# Patient Record
Sex: Male | Born: 1950 | Race: White | Hispanic: No | Marital: Married | Smoking: Current every day smoker
Health system: Southern US, Community
[De-identification: ages and names within clinical notes are randomized; demographics above are authoritative.]

## PROBLEM LIST (undated history)

## (undated) DIAGNOSIS — G20 Parkinson's disease: Secondary | ICD-10-CM

## (undated) DIAGNOSIS — G20A1 Parkinson's disease without dyskinesia, without mention of fluctuations: Secondary | ICD-10-CM

## (undated) DIAGNOSIS — F0390 Unspecified dementia without behavioral disturbance: Secondary | ICD-10-CM

## (undated) HISTORY — PX: BRAIN SURGERY: SHX531

---

## 2017-02-21 DIAGNOSIS — G3183 Dementia with Lewy bodies: Secondary | ICD-10-CM | POA: Diagnosis not present

## 2017-02-21 DIAGNOSIS — R456 Violent behavior: Secondary | ICD-10-CM | POA: Diagnosis not present

## 2017-02-21 DIAGNOSIS — F0390 Unspecified dementia without behavioral disturbance: Secondary | ICD-10-CM | POA: Diagnosis not present

## 2017-02-21 DIAGNOSIS — R4586 Emotional lability: Secondary | ICD-10-CM | POA: Diagnosis not present

## 2017-02-21 DIAGNOSIS — F17210 Nicotine dependence, cigarettes, uncomplicated: Secondary | ICD-10-CM | POA: Diagnosis not present

## 2017-02-21 DIAGNOSIS — Z79899 Other long term (current) drug therapy: Secondary | ICD-10-CM | POA: Diagnosis not present

## 2017-02-21 DIAGNOSIS — F0280 Dementia in other diseases classified elsewhere without behavioral disturbance: Secondary | ICD-10-CM | POA: Diagnosis not present

## 2017-02-22 ENCOUNTER — Emergency Department (HOSPITAL_COMMUNITY): Payer: Medicare Other

## 2017-02-22 ENCOUNTER — Encounter (HOSPITAL_COMMUNITY): Payer: Self-pay | Admitting: Emergency Medicine

## 2017-02-22 ENCOUNTER — Emergency Department (HOSPITAL_COMMUNITY)
Admission: EM | Admit: 2017-02-22 | Discharge: 2017-02-23 | Disposition: A | Discharge status: Other Acute Inpatient Hospital | Payer: Medicare Other | Attending: Emergency Medicine | Admitting: Emergency Medicine

## 2017-02-22 DIAGNOSIS — F0281 Dementia in other diseases classified elsewhere with behavioral disturbance: Secondary | ICD-10-CM

## 2017-02-22 DIAGNOSIS — F0391 Unspecified dementia with behavioral disturbance: Secondary | ICD-10-CM | POA: Diagnosis present

## 2017-02-22 DIAGNOSIS — F03918 Unspecified dementia, unspecified severity, with other behavioral disturbance: Secondary | ICD-10-CM | POA: Diagnosis present

## 2017-02-22 DIAGNOSIS — G20 Parkinson's disease: Secondary | ICD-10-CM

## 2017-02-22 DIAGNOSIS — F02818 Dementia in other diseases classified elsewhere, unspecified severity, with other behavioral disturbance: Secondary | ICD-10-CM

## 2017-02-22 DIAGNOSIS — Z008 Encounter for other general examination: Secondary | ICD-10-CM

## 2017-02-22 HISTORY — DX: Parkinson's disease: G20

## 2017-02-22 HISTORY — DX: Parkinson's disease without dyskinesia, without mention of fluctuations: G20.A1

## 2017-02-22 HISTORY — DX: Unspecified dementia, unspecified severity, without behavioral disturbance, psychotic disturbance, mood disturbance, and anxiety: F03.90

## 2017-02-22 LAB — COMPREHENSIVE METABOLIC PANEL
ALK PHOS: 76 U/L (ref 38–126)
ALT: 5 U/L — AB (ref 17–63)
ANION GAP: 7 (ref 5–15)
AST: 23 U/L (ref 15–41)
Albumin: 4.1 g/dL (ref 3.5–5.0)
BUN: 20 mg/dL (ref 6–20)
CALCIUM: 9.2 mg/dL (ref 8.9–10.3)
CHLORIDE: 107 mmol/L (ref 101–111)
CO2: 24 mmol/L (ref 22–32)
Creatinine, Ser: 1.14 mg/dL (ref 0.61–1.24)
Glucose, Bld: 114 mg/dL — ABNORMAL HIGH (ref 65–99)
Potassium: 3.8 mmol/L (ref 3.5–5.1)
SODIUM: 138 mmol/L (ref 135–145)
Total Bilirubin: 0.5 mg/dL (ref 0.3–1.2)
Total Protein: 6.7 g/dL (ref 6.5–8.1)

## 2017-02-22 LAB — RAPID URINE DRUG SCREEN, HOSP PERFORMED
AMPHETAMINES: NOT DETECTED
BENZODIAZEPINES: NOT DETECTED
Barbiturates: NOT DETECTED
COCAINE: NOT DETECTED
Opiates: NOT DETECTED
Tetrahydrocannabinol: NOT DETECTED

## 2017-02-22 LAB — CBC
HCT: 39.9 % (ref 39.0–52.0)
Hemoglobin: 13.7 g/dL (ref 13.0–17.0)
MCH: 31.2 pg (ref 26.0–34.0)
MCHC: 34.3 g/dL (ref 30.0–36.0)
MCV: 90.9 fL (ref 78.0–100.0)
PLATELETS: 212 10*3/uL (ref 150–400)
RBC: 4.39 MIL/uL (ref 4.22–5.81)
RDW: 13.3 % (ref 11.5–15.5)
WBC: 9.9 10*3/uL (ref 4.0–10.5)

## 2017-02-22 LAB — ACETAMINOPHEN LEVEL: Acetaminophen (Tylenol), Serum: <10 ug/mL — ABNORMAL LOW (ref 10–30)

## 2017-02-22 LAB — ETHANOL

## 2017-02-22 LAB — SALICYLATE LEVEL: Salicylate Lvl: <7 mg/dL (ref 2.8–30.0)

## 2017-02-22 MED ORDER — BUPROPION HCL ER (XL) 150 MG PO TB24
150.0000 mg | ORAL_TABLET | Freq: Every day | ORAL | Status: DC
  Administered 2017-02-22 – 2017-02-23 (×2): 150 mg via ORAL
  Filled 2017-02-22 (×2): qty 1

## 2017-02-22 MED ORDER — ROTIGOTINE 4 MG/24HR TD PT24
1.0000 | MEDICATED_PATCH | Freq: Every day | TRANSDERMAL | Status: DC
  Administered 2017-02-23: 1 via TRANSDERMAL

## 2017-02-22 MED ORDER — CARBIDOPA-LEVODOPA ER 25-100 MG PO TBCR
1.0000 | EXTENDED_RELEASE_TABLET | Freq: Four times a day (QID) | ORAL | Status: DC
  Administered 2017-02-22 – 2017-02-23 (×3): 1 via ORAL
  Filled 2017-02-22 (×5): qty 1

## 2017-02-22 MED ORDER — POLYETHYLENE GLYCOL 3350 17 G PO PACK
17.0000 g | PACK | Freq: Two times a day (BID) | ORAL | Status: DC
  Administered 2017-02-22 – 2017-02-23 (×2): 17 g via ORAL
  Filled 2017-02-22 (×3): qty 1

## 2017-02-22 MED ORDER — CARBIDOPA-LEVODOPA 25-100 MG PO TBDP
1.0000 | ORAL_TABLET | Freq: Three times a day (TID) | ORAL | Status: DC
  Administered 2017-02-22 – 2017-02-23 (×3): 1 via ORAL

## 2017-02-22 MED ORDER — PANTOPRAZOLE SODIUM 40 MG PO TBEC
40.0000 mg | DELAYED_RELEASE_TABLET | Freq: Every day | ORAL | Status: DC
  Administered 2017-02-22 – 2017-02-23 (×2): 40 mg via ORAL
  Filled 2017-02-22 (×2): qty 1

## 2017-02-22 NOTE — Progress Notes (Signed)
Deawna from [facility] called to report the pt has been added to the wait list.  Princess Bruins, MSW, LCSW Therapeutic Triage Specialist  [phone]
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
## 2017-02-22 NOTE — ED Notes (Signed)
Patient home medications sent to pharmacy and a copy of the patients healthcare power of attorney form was placed on chart.
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
## 2017-02-22 NOTE — BH Assessment (Signed)
[facility] Assessment Progress Note   TTS faxed the pt's referral to the following inpt facilities:  [facility], [place], [place], [place] Ephriam Jenkins, MSW, LCSW Therapeutic Triage Specialist  [phone]

## 2017-02-23 DIAGNOSIS — G4700 Insomnia, unspecified: Secondary | ICD-10-CM

## 2017-02-23 DIAGNOSIS — F17210 Nicotine dependence, cigarettes, uncomplicated: Secondary | ICD-10-CM

## 2017-02-23 DIAGNOSIS — R451 Restlessness and agitation: Secondary | ICD-10-CM

## 2017-02-23 DIAGNOSIS — R4586 Emotional lability: Secondary | ICD-10-CM

## 2017-02-23 DIAGNOSIS — F0391 Unspecified dementia with behavioral disturbance: Secondary | ICD-10-CM | POA: Diagnosis present

## 2017-02-23 DIAGNOSIS — R4587 Impulsiveness: Secondary | ICD-10-CM | POA: Diagnosis not present

## 2017-02-23 DIAGNOSIS — G3183 Dementia with Lewy bodies: Secondary | ICD-10-CM

## 2017-02-23 DIAGNOSIS — R413 Other amnesia: Secondary | ICD-10-CM

## 2017-02-23 DIAGNOSIS — F0280 Dementia in other diseases classified elsewhere without behavioral disturbance: Secondary | ICD-10-CM

## 2017-02-23 DIAGNOSIS — F03918 Unspecified dementia, unspecified severity, with other behavioral disturbance: Secondary | ICD-10-CM | POA: Diagnosis present

## 2017-02-23 MED ORDER — GABAPENTIN 100 MG PO CAPS
200.0000 mg | ORAL_CAPSULE | Freq: Two times a day (BID) | ORAL | Status: DC
  Administered 2017-02-23: 200 mg via ORAL
  Filled 2017-02-23: qty 2

## 2017-02-23 NOTE — BH Assessment (Signed)
[facility] Assessment Progress Note  Per Thedore Mins, MD, this pt requires psychiatric hospitalization at this time.  Dr Jannifer Franklin also finds that pt meets criteria for IVC, which he has initiated.  IVC documents have been faxed to [facility], and at [employer] confirms receipt.  As of this writing, service of Findings and Custody Order is pending. At 12:09 Delorise Shiner calls from [facility] to report that pt has been accepted to their facility by Dr Seth Bake.  Laveda Abbe, FNP concurs with this decision.  Pt's nurse, Donnal Debar, has been notified, and agrees to call report to [phone].  Pt is to be transported via [facility].  Doylene Canning, MA Triage Specialist [phone]
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
## 2017-02-23 NOTE — ED Notes (Signed)
[facility] department transporting patient to [place]

## 2017-11-09 DEATH — deceased

## 2018-03-22 IMAGING — DX DG CHEST 1V
1 series · 1 of 1 positions shown · non-contrast
Comparison: None.

CLINICAL DATA: Weakness.  History of pneumonia.

EXAM:
CHEST 1 VIEW

[chest ap]
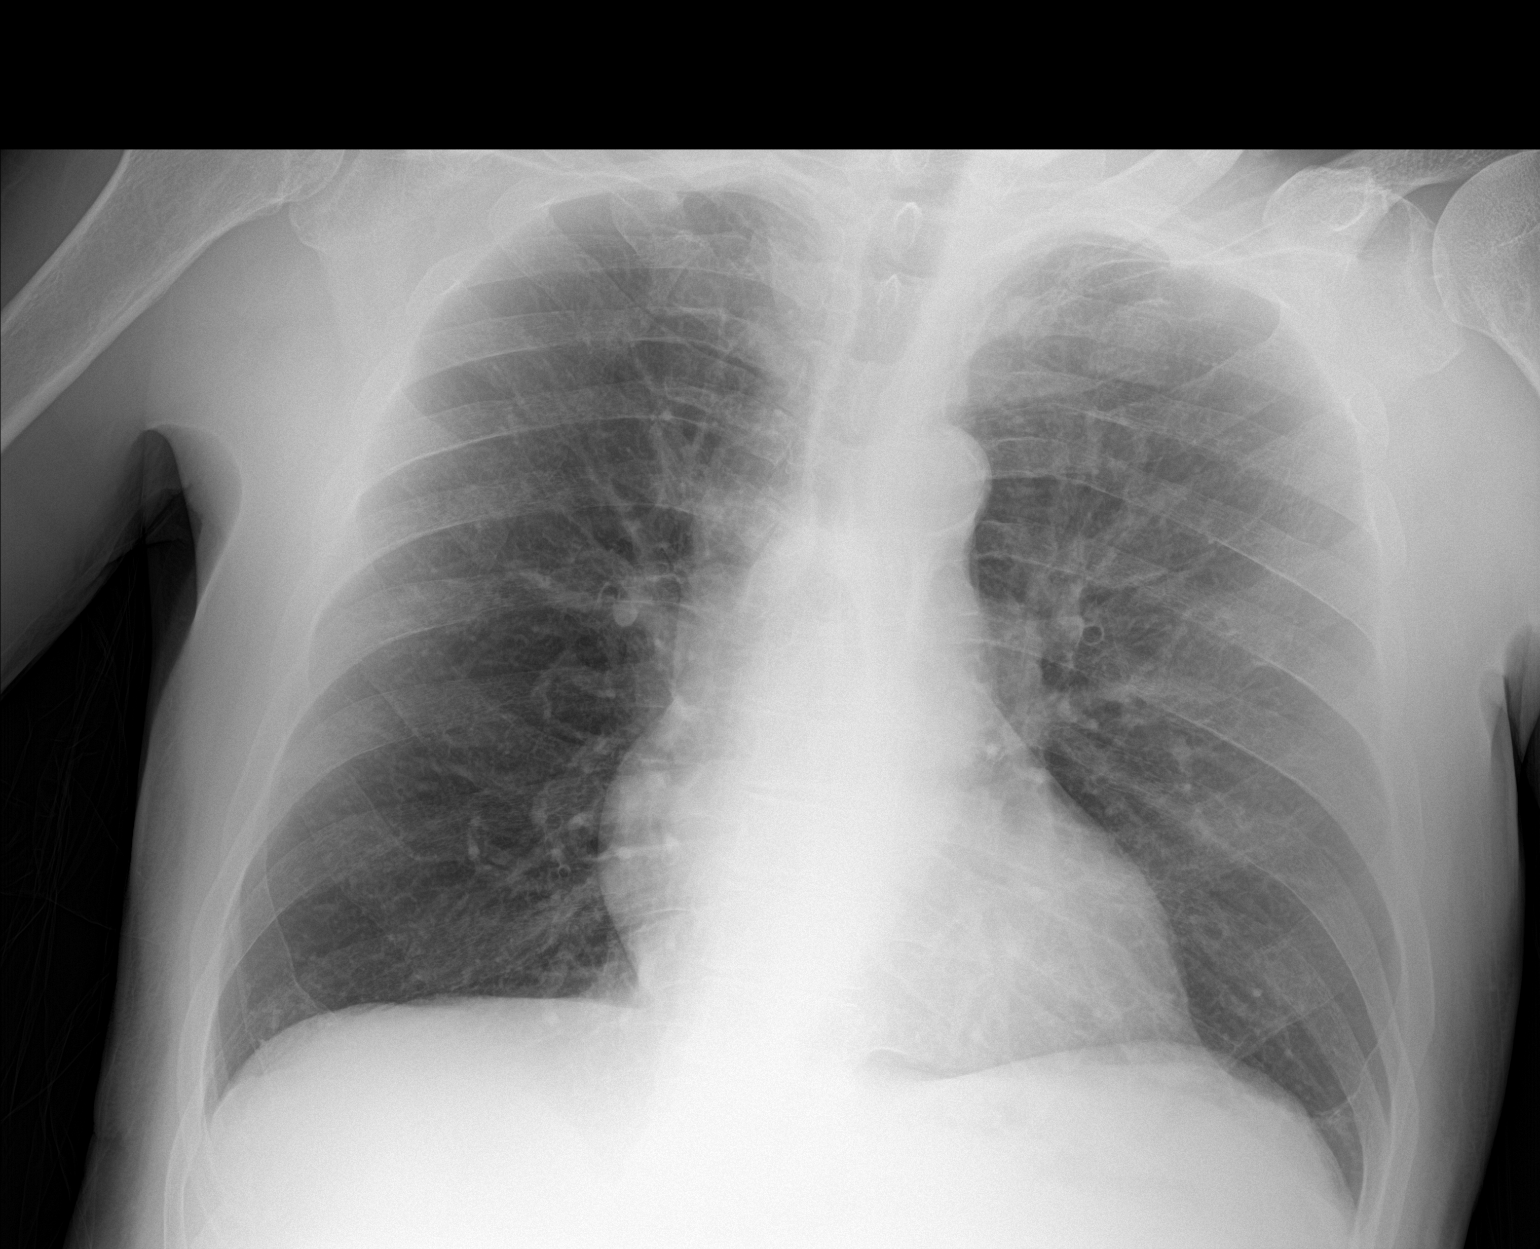

[1 of 1 positions shown; findings below may reference images not displayed]

FINDINGS: Mildly enlarged cardiac silhouette. Mediastinal contours appear
intact. Tortuosity and calcific atherosclerotic disease of the
aorta.

There is no evidence of focal airspace consolidation, pleural
effusion or pneumothorax.

Osseous structures are without acute abnormality. Soft tissues are
grossly normal.
IMPRESSION: No evidence of lobar consolidation.

Mildly enlarged cardiac silhouette.

Tortuosity and calcific atherosclerotic disease of the aorta.
